# Patient Record
Sex: Female | Born: 1995 | Race: White | Hispanic: No | Marital: Married | Smoking: Former smoker
Health system: Southern US, Community
[De-identification: ages and names within clinical notes are randomized; demographics above are authoritative.]

## PROBLEM LIST (undated history)

## (undated) ENCOUNTER — Emergency Department (HOSPITAL_COMMUNITY): Admission: EM | Payer: Self-pay | Source: Home / Self Care

## (undated) DIAGNOSIS — E282 Polycystic ovarian syndrome: Secondary | ICD-10-CM

## (undated) HISTORY — DX: Polycystic ovarian syndrome: E28.2

---
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
## 2015-01-02 IMAGING — CT CT ABD-PELV W/ CM
2 of 3 series · 17 of 46 positions shown, 19 images · IV contrast (Omnipaque 300)
Comparison: None.

CLINICAL DATA: abdominal pain

EXAM:
CT ABDOMEN AND PELVIS WITH CONTRAST
TECHNIQUE: Multidetector CT imaging of the abdomen and pelvis was performed
using the standard protocol following bolus administration of
intravenous contrast.
CONTRAST:  100mL OMNIPAQUE IOHEXOL 300 MG/ML  SOLN

[Series 2: abd_pel_with 5.0 b40f · axial · 0.80mm/px · z∈[-528,-98]mm · 14 of 100 slices shown, 16 images]
[im 7/100  soft-tissue]
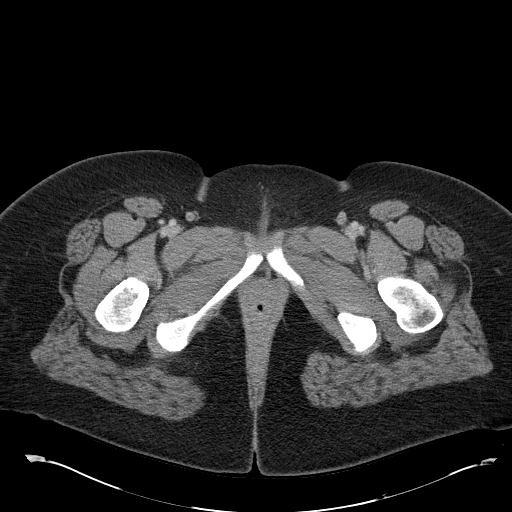
[im 7/100  bone]
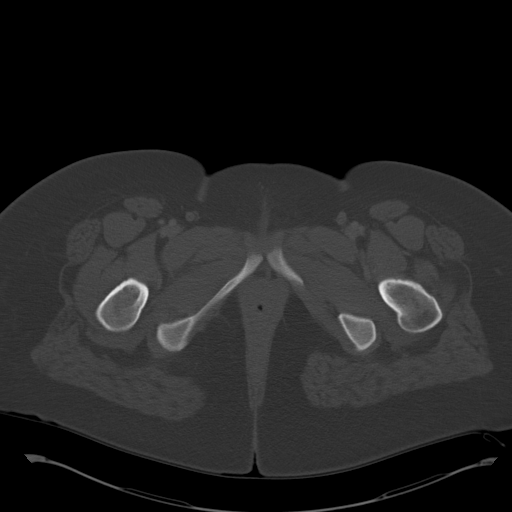
[im 13/100  soft-tissue]
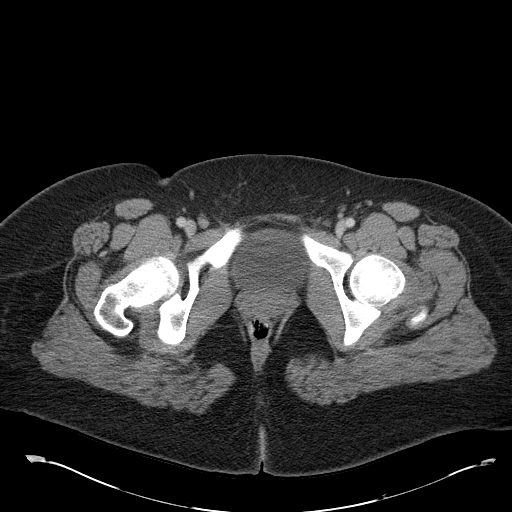
[im 20/100  soft-tissue]
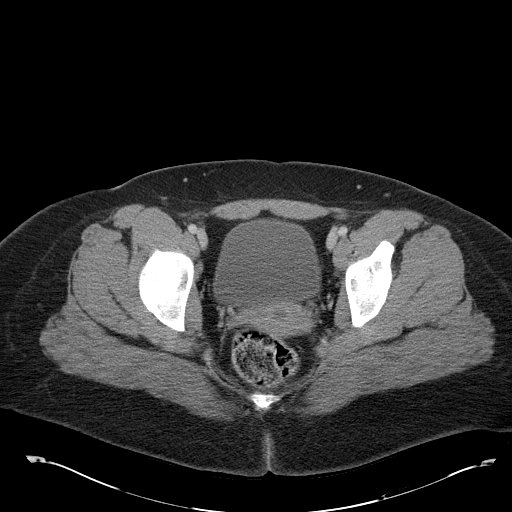
[im 26/100  soft-tissue]
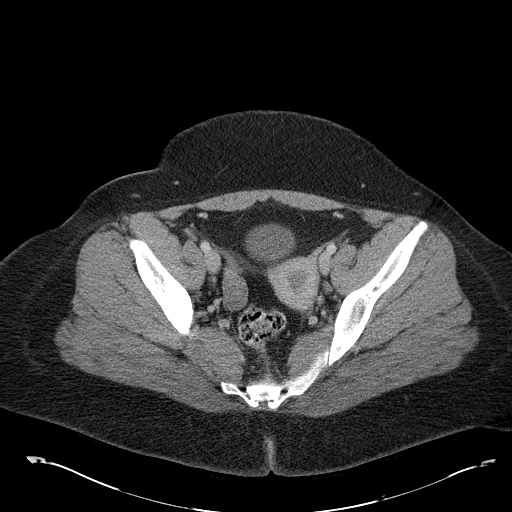
[im 32/100  soft-tissue]
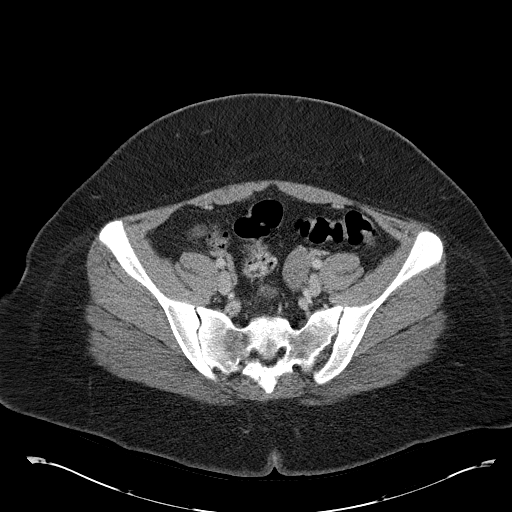
[im 39/100  soft-tissue]
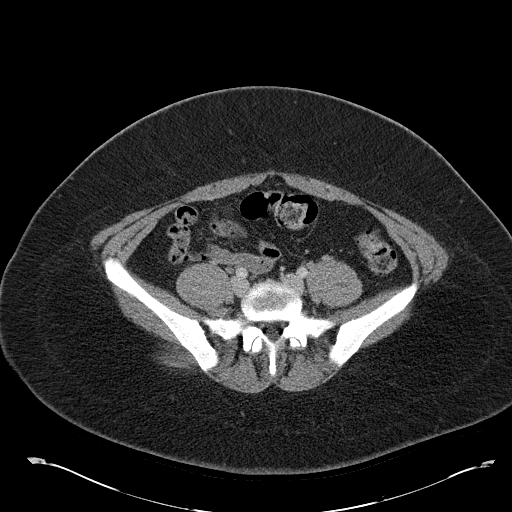
[im 45/100  soft-tissue]
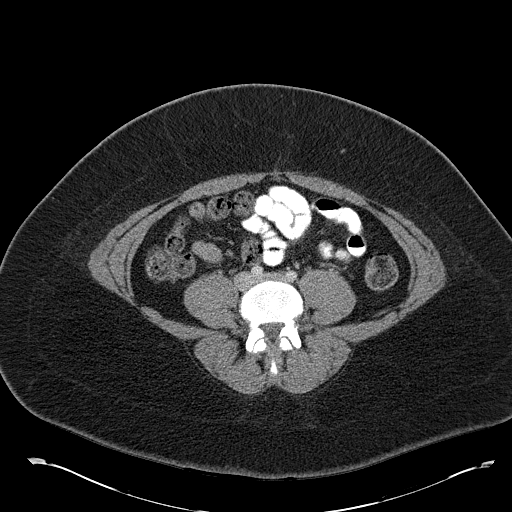
[im 55/100  soft-tissue]
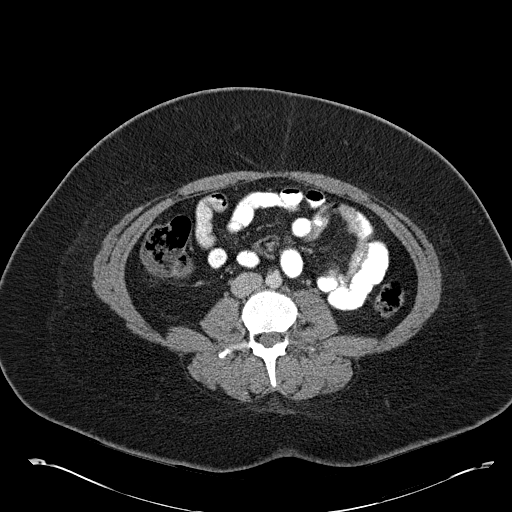
[im 61/100  soft-tissue]
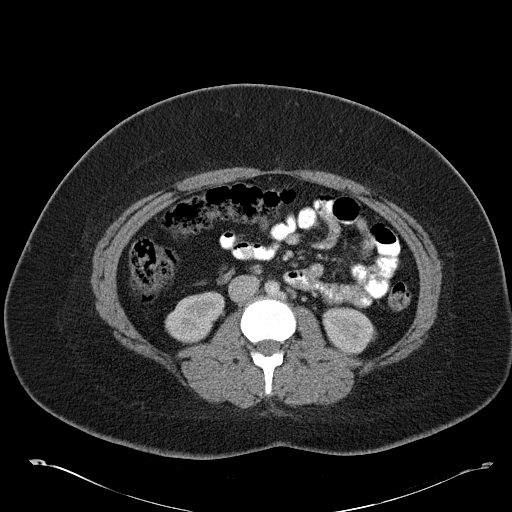
[im 61/100  bone]
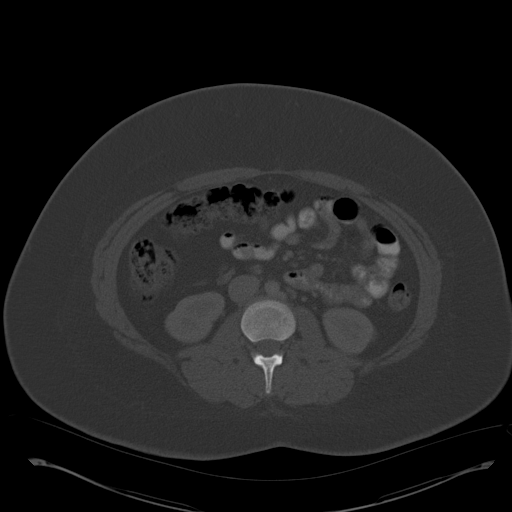
[im 68/100  soft-tissue]
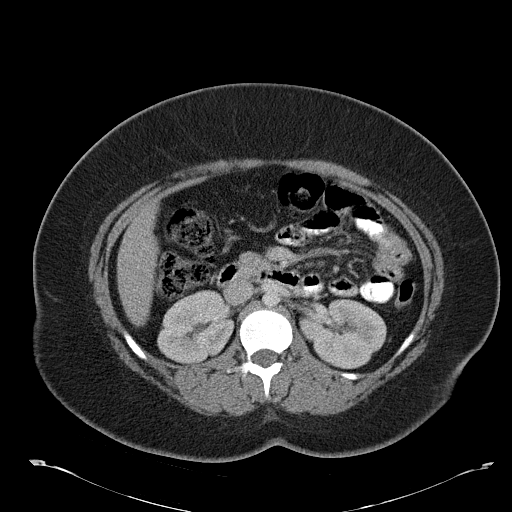
[im 74/100  soft-tissue]
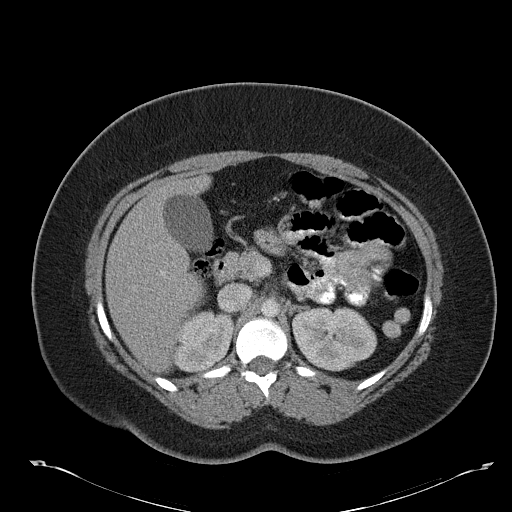
[im 80/100  soft-tissue]
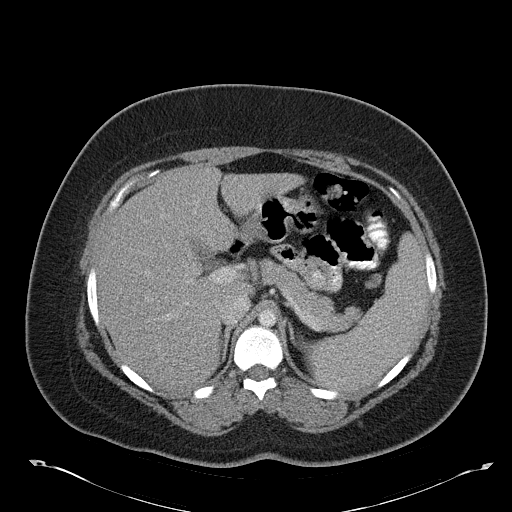
[im 87/100  soft-tissue]
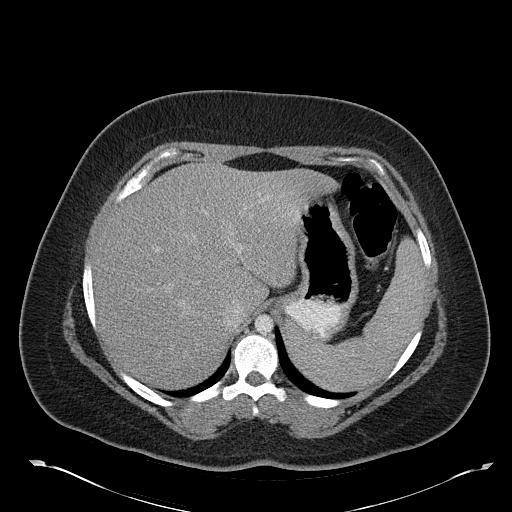
[im 93/100  soft-tissue]
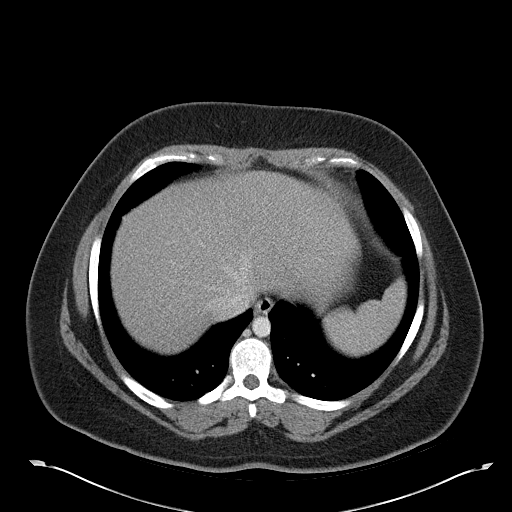

[Series 3: abd_pel_with 3.0 spo cor · coronal · 0.93mm/px · 3 of 96 slices shown]
[im 32/96  soft-tissue]
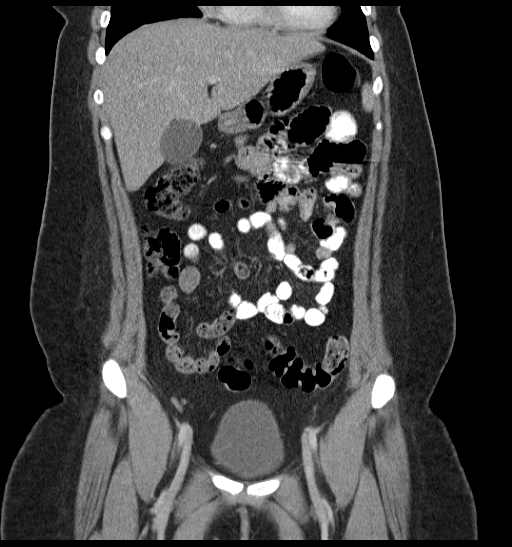
[im 43/96  soft-tissue]
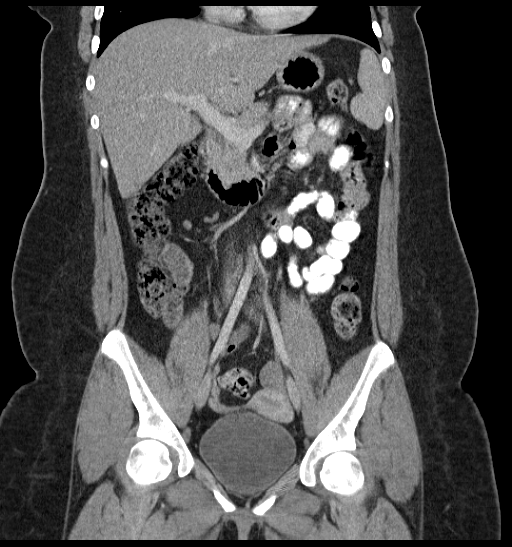
[im 53/96  soft-tissue]
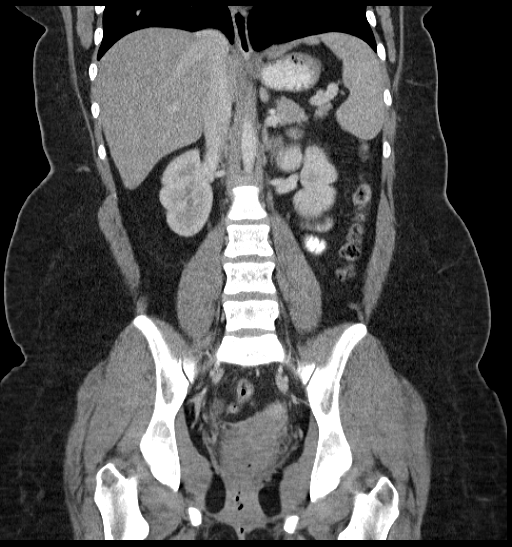

[17 of 46 positions shown; findings below may reference images not displayed]

FINDINGS: Visualized lung bases clear. Unremarkable liver, gallbladder,
spleen, pancreas, adrenal glands, kidneys, abdominal aorta, portal
vein. Circumaortic left renal vein, an anatomic variant. Stomach,
small bowel, colon are nondilated. Normal appendix. Urinary bladder
physiologically distended. Uterus and adnexal regions unremarkable.
No ascites. No free air. No adenopathy localized. Regional bones
unremarkable.
IMPRESSION: 1. No acute process.

## 2019-06-26 ENCOUNTER — Telehealth: Payer: Self-pay | Admitting: Adult Health

## 2019-06-26 NOTE — Telephone Encounter (Signed)

## 2019-06-27 ENCOUNTER — Ambulatory Visit: Payer: No Typology Code available for payment source | Admitting: Adult Health

## 2019-06-27 ENCOUNTER — Encounter: Payer: Self-pay | Admitting: Adult Health

## 2019-06-27 VITALS — BP 141/89 | HR 85 | Ht 66.0 in | Wt 316.8 lb

## 2019-06-27 DIAGNOSIS — D500 Iron deficiency anemia secondary to blood loss (chronic): Secondary | ICD-10-CM

## 2019-06-27 DIAGNOSIS — R030 Elevated blood-pressure reading, without diagnosis of hypertension: Secondary | ICD-10-CM | POA: Insufficient documentation

## 2019-06-27 DIAGNOSIS — Z3202 Encounter for pregnancy test, result negative: Secondary | ICD-10-CM

## 2019-06-27 DIAGNOSIS — Z8742 Personal history of other diseases of the female genital tract: Secondary | ICD-10-CM

## 2019-06-27 DIAGNOSIS — N926 Irregular menstruation, unspecified: Secondary | ICD-10-CM | POA: Diagnosis not present

## 2019-06-27 DIAGNOSIS — Z319 Encounter for procreative management, unspecified: Secondary | ICD-10-CM | POA: Insufficient documentation

## 2019-06-27 LAB — POCT HEMOGLOBIN: Hemoglobin: 10.8 g/dL — AB (ref 11–14.6)

## 2019-06-27 LAB — POCT URINE PREGNANCY: Preg Test, Ur: NEGATIVE

## 2019-06-27 NOTE — Progress Notes (Signed)
Patient ID: Darlene Mullins, female   DOB: 1995/04/13, 24 y.o.   MRN: [id number] History of Present Illness: Darlene Mullins is a 24 year old white female, engaged, G0P0, in to discuss periods. Will skip periods or may bleed for 3-4 months,was told about 3 years ago had PCO.   Current Medications, Allergies, Past Medical History, Past Surgical History, Family History and Social History were reviewed in [name] record.     Review of Systems: Has irregular periods, may skip or may bleed for 3-4 months, has bleed from January till about 3 days ago Denies any pain Was told had PCO about 3 years Would like to be pregnant    Physical Exam:BP (!) 141/89 (BP Location: Left Arm, Patient Position: Sitting, Cuff Size: Large)   Pulse 85   Ht 5\' 6"  (1.676 m)   Wt (!) 316 lb 12.8 oz (143.7 kg)   BMI 51.13 kg/m UPT is negative, hgb 10.8 General:  Well developed, well nourished, no acute distress Skin:  Warm and dry Neck:  Midline trachea, normal thyroid, good ROM, no lymphadenopathy Lungs; Clear to auscultation bilaterally Cardiovascular: Regular rate and rhythm Pelvic:  External genitalia is normal in appearance, no lesions.  The vagina is normal in appearance. Urethra has no lesions or masses. The cervix is smooth and pink  Uterus is felt to be normal size, shape, and contour.  No adnexal masses or tenderness noted.Bladder is non tender, no masses felt. Psych:  No mood changes, alert and cooperative,seems happy AA2 Fall risk is low PHQ 9 score is 0 Examination chaperoned by LPN  Impression and Plan: 1. Urine pregnancy test negative  2. Irregular menstrual bleeding Will get Darlene Mullins to assess uterus and ovaries.  3. History of PCOS Will get [place] to assess uterus and ovaries  4. Patient desires pregnancy Start PNV  discussed timing of sex  5. Elevated BP without diagnosis of hypertension Decrease salt and sugar Return about 1-2 weeks after [place] for pap and  physical   6. Iron deficiency anemia due to chronic blood loss Start OTC PNV

## 2019-06-27 NOTE — Patient Instructions (Signed)
DASH Eating Plan DASH stands for "Dietary Approaches to Stop Hypertension." The DASH eating plan is a healthy eating plan that has been shown to reduce high blood pressure (hypertension). It may also reduce your risk for type 2 diabetes, heart disease, and stroke. The DASH eating plan may also help with weight loss. What are tips for following this plan?  General guidelines  Avoid eating more than 2,300 mg (milligrams) of salt (sodium) a day. If you have hypertension, you may need to reduce your sodium intake to 1,500 mg a day.  Limit alcohol intake to no more than 1 drink a day for nonpregnant women and 2 drinks a day for men. One drink equals 12 oz of beer, 5 oz of wine, or 1 oz of hard liquor.  Work with your health care provider to maintain a healthy body weight or to lose weight. Ask what an ideal weight is for you.  Get at least 30 minutes of exercise that causes your heart to beat faster (aerobic exercise) most days of the week. Activities may include walking, swimming, or biking.  Work with your health care provider or diet and nutrition specialist (dietitian) to adjust your eating plan to your individual calorie needs. Reading food labels   Check food labels for the amount of sodium per serving. Choose foods with less than 5 percent of the Daily Value of sodium. Generally, foods with less than 300 mg of sodium per serving fit into this eating plan.  To find whole grains, look for the word "whole" as the first word in the ingredient list. Shopping  Buy products labeled as "low-sodium" or "no salt added."  Buy fresh foods. Avoid canned foods and premade or frozen meals. Cooking  Avoid adding salt when cooking. Use salt-free seasonings or herbs instead of table salt or sea salt. Check with your health care provider or pharmacist before using salt substitutes.  Do not fry foods. Cook foods using healthy methods such as baking, boiling, grilling, and broiling instead.  Cook with  heart-healthy oils, such as olive, canola, soybean, or sunflower oil. Meal planning  Eat a balanced diet that includes: ? 5 or more servings of fruits and vegetables each day. At each meal, try to fill half of your plate with fruits and vegetables. ? Up to 6-8 servings of whole grains each day. ? Less than 6 oz of lean meat, poultry, or fish each day. A 3-oz serving of meat is about the same size as a deck of cards. One egg equals 1 oz. ? 2 servings of low-fat dairy each day. ? A serving of nuts, seeds, or beans 5 times each week. ? Heart-healthy fats. Healthy fats called Omega-3 fatty acids are found in foods such as flaxseeds and coldwater fish, like sardines, salmon, and mackerel.  Limit how much you eat of the following: ? Canned or prepackaged foods. ? Food that is high in trans fat, such as fried foods. ? Food that is high in saturated fat, such as fatty meat. ? Sweets, desserts, sugary drinks, and other foods with added sugar. ? Full-fat dairy products.  Do not salt foods before eating.  Try to eat at least 2 vegetarian meals each week.  Eat more home-cooked food and less restaurant, buffet, and fast food.  When eating at a restaurant, ask that your food be prepared with less salt or no salt, if possible. What foods are recommended? The items listed may not be a complete list. Talk with your dietitian about   what dietary choices are best for you. Grains Whole-grain or whole-wheat bread. Whole-grain or whole-wheat pasta. Brown rice. Oatmeal. Quinoa. Bulgur. Whole-grain and low-sodium cereals. Pita bread. Low-fat, low-sodium crackers. Whole-wheat flour tortillas. Vegetables Fresh or frozen vegetables (raw, steamed, roasted, or grilled). Low-sodium or reduced-sodium tomato and vegetable juice. Low-sodium or reduced-sodium tomato sauce and tomato paste. Low-sodium or reduced-sodium canned vegetables. Fruits All fresh, dried, or frozen fruit. Canned fruit in natural juice (without  added sugar). Meat and other protein foods Skinless chicken or turkey. Ground chicken or turkey. Pork with fat trimmed off. Fish and seafood. Egg whites. Dried beans, peas, or lentils. Unsalted nuts, nut butters, and seeds. Unsalted canned beans. Lean cuts of beef with fat trimmed off. Low-sodium, lean deli meat. Dairy Low-fat (1%) or fat-free (skim) milk. Fat-free, low-fat, or reduced-fat cheeses. Nonfat, low-sodium ricotta or cottage cheese. Low-fat or nonfat yogurt. Low-fat, low-sodium cheese. Fats and oils Soft margarine without trans fats. Vegetable oil. Low-fat, reduced-fat, or light mayonnaise and salad dressings (reduced-sodium). Canola, safflower, olive, soybean, and sunflower oils. Avocado. Seasoning and other foods Herbs. Spices. Seasoning mixes without salt. Unsalted popcorn and pretzels. Fat-free sweets. What foods are not recommended? The items listed may not be a complete list. Talk with your dietitian about what dietary choices are best for you. Grains Baked goods made with fat, such as croissants, muffins, or some breads. Dry pasta or rice meal packs. Vegetables Creamed or fried vegetables. Vegetables in a cheese sauce. Regular canned vegetables (not low-sodium or reduced-sodium). Regular canned tomato sauce and paste (not low-sodium or reduced-sodium). Regular tomato and vegetable juice (not low-sodium or reduced-sodium). Pickles. Olives. Fruits Canned fruit in a light or heavy syrup. Fried fruit. Fruit in cream or butter sauce. Meat and other protein foods Fatty cuts of meat. Ribs. Fried meat. Bacon. Sausage. Bologna and other processed lunch meats. Salami. Fatback. Hotdogs. Bratwurst. Salted nuts and seeds. Canned beans with added salt. Canned or smoked fish. Whole eggs or egg yolks. Chicken or turkey with skin. Dairy Whole or 2% milk, cream, and half-and-half. Whole or full-fat cream cheese. Whole-fat or sweetened yogurt. Full-fat cheese. Nondairy creamers. Whipped toppings.  Processed cheese and cheese spreads. Fats and oils Butter. Stick margarine. Lard. Shortening. Ghee. Bacon fat. Tropical oils, such as coconut, palm kernel, or palm oil. Seasoning and other foods Salted popcorn and pretzels. Onion salt, garlic salt, seasoned salt, table salt, and sea salt. Worcestershire sauce. Tartar sauce. Barbecue sauce. Teriyaki sauce. Soy sauce, including reduced-sodium. Steak sauce. Canned and packaged gravies. Fish sauce. Oyster sauce. Cocktail sauce. Horseradish that you find on the shelf. Ketchup. Mustard. Meat flavorings and tenderizers. Bouillon cubes. Hot sauce and Tabasco sauce. Premade or packaged marinades. Premade or packaged taco seasonings. Relishes. Regular salad dressings. Where to find more information:  National Heart, Lung, and Blood Institute: www.nhlbi.nih.gov  American Heart Association: www.heart.org Summary  The DASH eating plan is a healthy eating plan that has been shown to reduce high blood pressure (hypertension). It may also reduce your risk for type 2 diabetes, heart disease, and stroke.  With the DASH eating plan, you should limit salt (sodium) intake to 2,300 mg a day. If you have hypertension, you may need to reduce your sodium intake to 1,500 mg a day.  When on the DASH eating plan, aim to eat more fresh fruits and vegetables, whole grains, lean proteins, low-fat dairy, and heart-healthy fats.  Work with your health care provider or diet and nutrition specialist (dietitian) to adjust your eating plan to your   individual calorie needs. This information is not intended to replace advice given to you by your health care provider. Make sure you discuss any questions you have with your health care provider. Document Revised: 12/30/2016 Document Reviewed: 01/11/2016 Elsevier Patient Education  2020 Elsevier Inc.  

## 2019-07-03 ENCOUNTER — Telehealth: Payer: Self-pay | Admitting: Adult Health

## 2019-07-03 NOTE — Telephone Encounter (Signed)

## 2019-07-04 ENCOUNTER — Ambulatory Visit (INDEPENDENT_AMBULATORY_CARE_PROVIDER_SITE_OTHER): Payer: No Typology Code available for payment source

## 2019-07-04 DIAGNOSIS — Z8742 Personal history of other diseases of the female genital tract: Secondary | ICD-10-CM

## 2019-07-04 DIAGNOSIS — N926 Irregular menstruation, unspecified: Secondary | ICD-10-CM | POA: Diagnosis not present

## 2019-07-04 NOTE — Progress Notes (Signed)
PELVIC US TA/TV:homogeneous anteverted uterus,wnl,EEC 16 mm,enlarged ovaries with multiple small peripheral follicles,ovaries appear mobile,no free fluid,no pain during ultrasound  Chaperone Peggy

## 2019-07-09 ENCOUNTER — Other Ambulatory Visit (HOSPITAL_COMMUNITY)
Admission: RE | Admit: 2019-07-09 | Discharge: 2019-07-09 | Disposition: A | Discharge status: Home / Self Care | Payer: No Typology Code available for payment source | Source: Ambulatory Visit | Attending: Adult Health | Admitting: Adult Health

## 2019-07-09 ENCOUNTER — Encounter: Payer: Self-pay | Admitting: Adult Health

## 2019-07-09 ENCOUNTER — Ambulatory Visit (INDEPENDENT_AMBULATORY_CARE_PROVIDER_SITE_OTHER): Payer: No Typology Code available for payment source | Admitting: Adult Health

## 2019-07-09 VITALS — BP 134/88 | HR 88 | Ht 67.0 in | Wt 318.0 lb

## 2019-07-09 DIAGNOSIS — E282 Polycystic ovarian syndrome: Secondary | ICD-10-CM

## 2019-07-09 DIAGNOSIS — Z01419 Encounter for gynecological examination (general) (routine) without abnormal findings: Secondary | ICD-10-CM | POA: Insufficient documentation

## 2019-07-09 DIAGNOSIS — N926 Irregular menstruation, unspecified: Secondary | ICD-10-CM | POA: Diagnosis not present

## 2019-07-09 DIAGNOSIS — Z3202 Encounter for pregnancy test, result negative: Secondary | ICD-10-CM

## 2019-07-09 DIAGNOSIS — Z8742 Personal history of other diseases of the female genital tract: Secondary | ICD-10-CM

## 2019-07-09 LAB — POCT URINE PREGNANCY: Preg Test, Ur: NEGATIVE

## 2019-07-09 MED ORDER — MEDROXYPROGESTERONE ACETATE 10 MG PO TABS: ORAL_TABLET | ORAL | 0 refills | Status: DC

## 2019-07-09 NOTE — Progress Notes (Addendum)
Patient ID: Darlene Mullins, female   DOB: 1995/02/01, 24 y.o.   MRN: [id number] History of Present Illness: Darlene Mullins is a 24 year old white female, single with partner, G0P0, but would like a pregnancy maybe early next year.She is here for well woman gyn exam and pap.    Current Medications, Allergies, Past Medical History, Past Surgical History, Family History and Social History were reviewed in [name] record.     Review of Systems: Patient denies any headaches, hearing loss, fatigue, blurred vision, shortness of breath, chest pain, abdominal pain, problems with bowel movements, urination, or intercourse. No joint pain or mood swings. +irreguarl periods +facial hair    Physical Exam:BP 134/88 (BP Location: Left Arm, Patient Position: Sitting, Cuff Size: Large)   Pulse 88   Ht 5\' 7"  (1.702 m)   Wt (!) 318 lb (144.2 kg)   BMI 49.81 kg/m UPT is negative. General:  Well developed, well nourished, no acute distress Skin:  Warm and dry,+chin hair,shaves Neck:  Midline trachea, normal thyroid, good ROM, no lymphadenopathy Lungs; Clear to auscultation bilaterally Breast:  No dominant palpable mass, retraction, or nipple discharge Cardiovascular: Regular rate and rhythm Abdomen:  Soft, non tender, no hepatosplenomegaly,obese Pelvic:  External genitalia is normal in appearance, no lesions.  The vagina is normal in appearance,period like blood. Urethra has no lesions or masses. The cervix is smooth, pap with GC/CH performed,  Uterus is felt to be normal size, shape, and contour.  No adnexal masses or tenderness noted.Bladder is non tender, no masses felt. Has several skin tags inner thighs Extremities/musculoskeletal:  No swelling or varicosities noted, no clubbing or cyanosis Psych:  No mood changes, alert and cooperative,seems happy AA 2 Fall risk is low PHQ 9 score is 2 Examination chaperoned by LPN Reviewed Malachy Mood: normal [place] EEC 16 mm, ovaries enlarged  with string of pearls appearance of PCO  Impression and plan: 1. Encounter for gynecological examination with Papanicolaou smear of cervix Pap sent Physical in 1 year Pap in 3 if normal Check CBC,CMP,TSH   2. Pregnancy examination or test, negative result   3. Irregular menstrual bleeding Will rx provera 10 mg for 10 days to get withdrawal bleed, then cycle for 3-6 months with OCs, like Loestrin 1.5-30 Will see back 6/24 to see how much bleeding she had  CheckCMP,TSH and A1c   4. History of PCOS Try to lose about 30 lbs

## 2019-07-10 ENCOUNTER — Telehealth: Payer: Self-pay | Admitting: Adult Health

## 2019-07-10 LAB — CYTOLOGY - PAP
Chlamydia: NEGATIVE
Comment: NEGATIVE
Comment: NORMAL
Diagnosis: NEGATIVE
Neisseria Gonorrhea: NEGATIVE

## 2019-07-10 LAB — COMPREHENSIVE METABOLIC PANEL
ALT: 32 IU/L (ref 0–32)
AST: 23 IU/L (ref 0–40)
Albumin/Globulin Ratio: 1.5 (ref 1.2–2.2)
Albumin: 4.7 g/dL (ref 3.9–5.0)
Alkaline Phosphatase: 75 IU/L (ref 48–121)
BUN/Creatinine Ratio: 16 (ref 9–23)
BUN: 13 mg/dL (ref 6–20)
Bilirubin Total: 0.5 mg/dL (ref 0.0–1.2)
CO2: 25 mmol/L (ref 20–29)
Calcium: 9.8 mg/dL (ref 8.7–10.2)
Chloride: 99 mmol/L (ref 96–106)
Creatinine, Ser: 0.79 mg/dL (ref 0.57–1.00)
GFR calc Af Amer: 121 mL/min/{1.73_m2} (ref >59)
GFR calc non Af Amer: 105 mL/min/{1.73_m2} (ref >59)
Globulin, Total: 3.1 g/dL (ref 1.5–4.5)
Glucose: 96 mg/dL (ref 65–99)
Potassium: 4.4 mmol/L (ref 3.5–5.2)
Sodium: 140 mmol/L (ref 134–144)
Total Protein: 7.8 g/dL (ref 6.0–8.5)

## 2019-07-10 LAB — TSH: TSH: 4.67 u[IU]/mL — ABNORMAL HIGH (ref 0.450–4.500)

## 2019-07-10 LAB — CBC
Hematocrit: 38.3 % (ref 34.0–46.6)
Hemoglobin: 12.3 g/dL (ref 11.1–15.9)
MCH: 26.1 pg — ABNORMAL LOW (ref 26.6–33.0)
MCHC: 32.1 g/dL (ref 31.5–35.7)
MCV: 81 fL (ref 79–97)
Platelets: 303 10*3/uL (ref 150–450)
RBC: 4.71 x10E6/uL (ref 3.77–5.28)
RDW: 13.1 % (ref 11.7–15.4)
WBC: 9.3 10*3/uL (ref 3.4–10.8)

## 2019-07-10 LAB — HEMOGLOBIN A1C
Est. average glucose Bld gHb Est-mCnc: 111 mg/dL
Hgb A1c MFr Bld: 5.5 % (ref 4.8–5.6)

## 2019-07-10 NOTE — Telephone Encounter (Signed)
Pt aware of labs and that TSH slightly elevated, will recheck about 3 weeks, and pap was negative for malignancy and GC/CHL.

## 2019-07-25 ENCOUNTER — Ambulatory Visit: Payer: No Typology Code available for payment source | Admitting: Adult Health

## 2019-07-31 ENCOUNTER — Telehealth: Payer: Self-pay | Admitting: Adult Health

## 2019-07-31 NOTE — Telephone Encounter (Signed)

## 2019-08-01 ENCOUNTER — Ambulatory Visit (INDEPENDENT_AMBULATORY_CARE_PROVIDER_SITE_OTHER): Payer: No Typology Code available for payment source | Admitting: Adult Health

## 2019-08-01 ENCOUNTER — Encounter: Payer: Self-pay | Admitting: Adult Health

## 2019-08-01 VITALS — BP 141/86 | HR 93 | Ht 67.0 in | Wt 321.5 lb

## 2019-08-01 DIAGNOSIS — R7989 Other specified abnormal findings of blood chemistry: Secondary | ICD-10-CM | POA: Diagnosis not present

## 2019-08-01 DIAGNOSIS — Z8742 Personal history of other diseases of the female genital tract: Secondary | ICD-10-CM

## 2019-08-01 DIAGNOSIS — N926 Irregular menstruation, unspecified: Secondary | ICD-10-CM | POA: Diagnosis not present

## 2019-08-01 DIAGNOSIS — Z319 Encounter for procreative management, unspecified: Secondary | ICD-10-CM | POA: Diagnosis not present

## 2019-08-01 MED ORDER — NORETHIN ACE-ETH ESTRAD-FE 1.5-30 MG-MCG PO TABS: 1.0000 | ORAL_TABLET | Freq: Every day | ORAL | 11 refills | Status: DC

## 2019-08-01 NOTE — Progress Notes (Addendum)
  Subjective:     Patient ID: Darlene Mullins, female   DOB: 26-Dec-1995, 24 y.o.   MRN: [id number]  HPI Darlene Mullins is a 24 year old white female with SO, G0P0 back in follow up on starting provera and did have a period, still on.   Review of Systems She did have bleeding with provera, still on Reviewed past medical,surgical, social and family history. Reviewed medications and allergies.     Objective:   Physical Exam BP (!) 141/86 (BP Location: Left Arm, Patient Position: Sitting, Cuff Size: Large)   Pulse 93   Ht 5\' 7"  (1.702 m)   Wt (!) 321 lb 8 oz (145.8 kg)   LMP 07/13/2019 (Approximate)   BMI 50.35 kg/m  Skin warm and dry. Lungs: clear to ausculation bilaterally. Cardiovascular: regular rate and rhythm.  Upstream - 08/01/19 0959      Pregnancy Intention Screening   Does the patient want to become pregnant in the next year? Yes    Does the patient's partner want to become pregnant in the next year? No    Would the patient like to discuss contraceptive options today? No      Contraception Wrap Up   Current Method Pregnant/Seeking Pregnancy    End Method Pregnant/Seeking Pregnancy    Contraception Counseling Provided No         Reviewed labs and pap with her again.     Assessment:     1. Irregular menstrual bleeding Will rx Loestrin 1.5 30 Fe to start Sunday, will use to cycle periods for 3-6 months  Meds ordered this encounter  Medications  . norethindrone-ethinyl estradiol-iron (LOESTRIN FE) 1.5-30 MG-MCG tablet    Sig: Take 1 tablet by mouth daily.    Dispense:  28 tablet    Refill:  11    Order Specific Question:   Supervising Provider    Answer:   12-04-1977 H [[id number]]    2. History of PCOS  3. Patient desires pregnancy Did discuss if she does not ovulate after stopping OCs, can try clomid to induce ovulation   4. Elevated TSH Recheck TSH and Free T4    Plan:     Work on losing some weight Follow up in 3 months

## 2019-08-02 LAB — T4, FREE: Free T4: 1.28 ng/dL (ref 0.82–1.77)

## 2019-08-02 LAB — TSH: TSH: 2.74 u[IU]/mL (ref 0.450–4.500)

## 2019-08-06 ENCOUNTER — Telehealth: Payer: Self-pay | Admitting: *Deleted

## 2019-08-06 NOTE — Telephone Encounter (Signed)
-----   Message from Adline Potter, NP sent at 08/06/2019  9:28 AM EDT ----- Let pt know thyroid is normal

## 2019-08-06 NOTE — Telephone Encounter (Signed)
Pt aware thyroid is normal. Pt voiced understanding. JSY

## 2019-08-22 ENCOUNTER — Telehealth: Payer: Self-pay | Admitting: Adult Health

## 2019-08-22 NOTE — Telephone Encounter (Signed)
Patient wants someone to review birth control instructions with her

## 2019-08-23 NOTE — Telephone Encounter (Signed)
Called patient back she wanted to know if she should take the inactive pills. I told her it wouldn't be a bad idea just to take them to stay in the habit of taking a pill everyday. Patient agreeable and had no other questions at this time.

## 2019-11-04 ENCOUNTER — Encounter: Payer: Self-pay | Admitting: Adult Health

## 2019-11-04 ENCOUNTER — Ambulatory Visit (INDEPENDENT_AMBULATORY_CARE_PROVIDER_SITE_OTHER): Payer: No Typology Code available for payment source | Admitting: Adult Health

## 2019-11-04 VITALS — BP 138/79 | HR 86 | Ht 65.0 in | Wt 317.2 lb

## 2019-11-04 DIAGNOSIS — N926 Irregular menstruation, unspecified: Secondary | ICD-10-CM

## 2019-11-04 DIAGNOSIS — Z319 Encounter for procreative management, unspecified: Secondary | ICD-10-CM | POA: Diagnosis not present

## 2019-11-04 DIAGNOSIS — Z8742 Personal history of other diseases of the female genital tract: Secondary | ICD-10-CM | POA: Diagnosis not present

## 2019-11-04 NOTE — Progress Notes (Signed)
°  Subjective:     Patient ID: Darlene Mullins, female   DOB: 1995-02-10, 24 y.o.   MRN: [id number]  HPI Darlene Mullins is a 24 year old white female,engaged, getting married, [place]19 [place] the beach, back in follow up on cycling with OCs and had period every month, stopped OCs in September with LMP.   Review of Systems Periods regular with OCs Reviewed past medical,surgical, social and family history. Reviewed medications and allergies.     Objective:   Physical Exam BP 138/79 (BP Location: Left Arm, Patient Position: Sitting, Cuff Size: Large)    Pulse 86    Ht 5\' 5"  (1.651 m)    Wt (!) 317 lb 3.2 oz (143.9 kg)    LMP 10/29/2019 (Exact Date)    BMI 52.78 kg/m  Skin warm and dry.  Lungs: clear to ausculation bilaterally. Cardiovascular: regular rate and rhythm. Has lost 4 lbs  Upstream - 11/04/19 0839      Pregnancy Intention Screening   Does the patient want to become pregnant in the next year? Yes    Does the patient's partner want to become pregnant in the next year? Yes    Would the patient like to discuss contraceptive options today? No      Contraception Wrap Up   Current Method No Contraceptive Precautions    End Method No Contraception Precautions;Pregnant/Seeking Pregnancy    Contraception Counseling Provided No             Assessment:     1. Patient desires pregnancy Start OTC PNV  2. History of PCOS   3. Irregular menstrual bleeding     Plan:     Call me if period or not in October, may need clomid

## 2019-12-18 ENCOUNTER — Other Ambulatory Visit: Payer: Self-pay

## 2019-12-18 ENCOUNTER — Telehealth: Payer: Self-pay | Admitting: *Deleted

## 2019-12-18 ENCOUNTER — Other Ambulatory Visit: Payer: Self-pay | Admitting: Advanced Practice Midwife

## 2019-12-18 ENCOUNTER — Ambulatory Visit (INDEPENDENT_AMBULATORY_CARE_PROVIDER_SITE_OTHER): Payer: No Typology Code available for payment source | Admitting: Adult Health

## 2019-12-18 ENCOUNTER — Encounter: Payer: Self-pay | Admitting: Adult Health

## 2019-12-18 VITALS — BP 135/87 | HR 86 | Ht 67.0 in | Wt 302.6 lb

## 2019-12-18 DIAGNOSIS — N926 Irregular menstruation, unspecified: Secondary | ICD-10-CM | POA: Diagnosis not present

## 2019-12-18 DIAGNOSIS — Z319 Encounter for procreative management, unspecified: Secondary | ICD-10-CM | POA: Diagnosis not present

## 2019-12-18 LAB — POCT URINE PREGNANCY: Preg Test, Ur: NEGATIVE

## 2019-12-18 MED ORDER — PRENATAL PLUS IRON 29-1 MG PO TABS: ORAL_TABLET | ORAL | 12 refills | Status: DC

## 2019-12-18 MED ORDER — PRENATAL VITAMINS 28-0.8 MG PO TABS
1.0000 | ORAL_TABLET | Freq: Every day | ORAL | 11 refills | Status: DC
Start: 2019-12-18 — End: 2020-03-25

## 2019-12-18 MED ORDER — CLOMIPHENE CITRATE 50 MG PO TABS: ORAL_TABLET | ORAL | 2 refills | Status: DC

## 2019-12-18 NOTE — Telephone Encounter (Signed)
Patient states this is for the prenatal

## 2019-12-18 NOTE — Progress Notes (Signed)
  Subjective:     Patient ID: Darlene Mullins, female   DOB: 1995/09/12, 24 y.o.   MRN: [id number]  HPI Darlene Mullins is a 24 year old white female, married, G0P0, in having missed a period and desires pregnancy.   Review of Systems No period since September Had COVID 2 weeks ago  Reviewed past medical,surgical, social and family history. Reviewed medications and allergies.     Objective:   Physical Exam BP 135/87 (BP Location: Right Arm, Patient Position: Sitting, Cuff Size: Large)   Pulse 86   Ht 5\' 7"  (1.702 m)   Wt (!) 302 lb 9.6 oz (137.3 kg)   LMP 10/18/2019 (Approximate)   BMI 47.39 kg/m UPT is negative She has lost 15 lbs since 11/04/19   Upstream - 12/18/19 1203      Pregnancy Intention Screening   Does the patient want to become pregnant in the next year? Yes    Does the patient's partner want to become pregnant in the next year? Yes    Would the patient like to discuss contraceptive options today? No      Contraception Wrap Up   Current Method Pregnant/Seeking Pregnancy    End Method Pregnant/Seeking Pregnancy    Contraception Counseling Provided No             Assessment:     1. Missed periods UPT is negative   2. Patient desires pregnancy Will start clomid today, have sex every other day 7-24 of cycle and take PNV Continue weight loss efforts  Meds ordered this encounter  Medications  . clomiPHENE (CLOMID) 50 MG tablet    Sig: Take 1 daily for 5 days, on days 3-7 of cycle    Dispense:  5 tablet    Refill:  2    Order Specific Question:   Supervising Provider    Answer:   Darlene Mullins [[id number]]  . Prenatal Vit-Iron Carbonyl-FA (PRENATAL PLUS IRON) 29-1 MG TABS    Sig: Take 1 daily    Dispense:  30 tablet    Refill:  12    Order Specific Question:   Supervising Provider    Answer:   Darlene Mullins [[id number]]      Plan:     Check progesterone level 12/6

## 2019-12-18 NOTE — Telephone Encounter (Signed)
Patient called stating [facility] could not fill her prescription, they told her she would need a different pill.

## 2020-01-06 ENCOUNTER — Telehealth: Payer: Self-pay

## 2020-01-06 NOTE — Telephone Encounter (Signed)
Pt wanted Darlene Mullins to know that since she started the medication she has not had a period

## 2020-01-06 NOTE — Telephone Encounter (Signed)
LMP end of September, had negative HPT and had progesterone level drawn today will talk when that is back

## 2020-01-07 LAB — PROGESTERONE: Progesterone: 0.6 ng/mL

## 2020-01-08 ENCOUNTER — Telehealth: Payer: Self-pay

## 2020-01-08 NOTE — Telephone Encounter (Signed)
Pt wanting to know if there will be another round of ovulation medication ordered?

## 2020-01-08 NOTE — Telephone Encounter (Signed)
Called patient back. She wanted to know if she can go ahead and start the clomid now since she didn't have a period.

## 2020-01-13 NOTE — Telephone Encounter (Signed)
Pt aware that she has refills on clomid, and HPT negative can start clomid and count that day as day 3 and let me know when day 21 is so I can place order to check progesterone level. She is aware that can go as high as 150 mg and if still no ovulation or pregnancy will refer to fertility specialist

## 2020-03-17 ENCOUNTER — Telehealth: Payer: Self-pay | Admitting: Adult Health

## 2020-03-17 NOTE — Telephone Encounter (Signed)
Has appt next week, has questions about clomid, answered.

## 2020-03-17 NOTE — Telephone Encounter (Signed)
Patient wants Cyril Mourning to call her so she can discuss some things with her, per patient. (patient didn't give a reason) Clinical staff will follow up with patient.

## 2020-03-19 ENCOUNTER — Ambulatory Visit: Payer: No Typology Code available for payment source | Admitting: Adult Health

## 2020-03-25 ENCOUNTER — Encounter: Payer: Self-pay | Admitting: Adult Health

## 2020-03-25 ENCOUNTER — Other Ambulatory Visit: Payer: Self-pay

## 2020-03-25 ENCOUNTER — Ambulatory Visit (INDEPENDENT_AMBULATORY_CARE_PROVIDER_SITE_OTHER): Payer: No Typology Code available for payment source | Admitting: Adult Health

## 2020-03-25 VITALS — BP 152/93 | HR 102 | Ht 68.0 in | Wt 316.0 lb

## 2020-03-25 DIAGNOSIS — Z3202 Encounter for pregnancy test, result negative: Secondary | ICD-10-CM | POA: Diagnosis not present

## 2020-03-25 DIAGNOSIS — N926 Irregular menstruation, unspecified: Secondary | ICD-10-CM

## 2020-03-25 DIAGNOSIS — Z8742 Personal history of other diseases of the female genital tract: Secondary | ICD-10-CM | POA: Diagnosis not present

## 2020-03-25 DIAGNOSIS — Z319 Encounter for procreative management, unspecified: Secondary | ICD-10-CM

## 2020-03-25 LAB — POCT URINE PREGNANCY: Preg Test, Ur: NEGATIVE

## 2020-03-25 MED ORDER — PRENATAL VITAMINS 28-0.8 MG PO TABS: 1.0000 | ORAL_TABLET | Freq: Every day | ORAL | 11 refills | Status: DC

## 2020-03-25 NOTE — Progress Notes (Signed)
  Subjective:     Patient ID: Darlene Mullins, female   DOB: February 03, 1995, 25 y.o.   MRN: [id number]  HPI Darlene Mullins is a 25 year old white female, married, G0P0 back in follow up on taking clomid, last took in December, has progesterone level of 0.6 in December. She had period in January but none for February and has breast tenderness.   Review of Systems Missed February period +breast tenderness +weight gain of about 14lbs since November  Reviewed past medical,surgical, social and family history. Reviewed medications and allergies.     Objective:   Physical Exam BP (!) 152/93 (BP Location: Left Arm, Patient Position: Sitting, Cuff Size: Large)   Pulse (!) 102   Ht 5\' 8"  (1.727 m)   Wt (!) 316 lb (143.3 kg)   LMP 02/19/2020   BMI 48.05 kg/m     UPT is negative. Skin warm and dry. Lungs: clear to ausculation bilaterally. Cardiovascular: regular rate and rhythm. Fall risk is low  Upstream - 03/25/20 1356      Pregnancy Intention Screening   Does the patient want to become pregnant in the next year? Yes    Does the patient's partner want to become pregnant in the next year? Yes    Would the patient like to discuss contraceptive options today? No      Contraception Wrap Up   Current Method Pregnant/Seeking Pregnancy    End Method Pregnant/Seeking Pregnancy    Contraception Counseling Provided No          Assessment:     1. Pregnancy examination or test, negative result   2. History of PCOS Try to lose weight   3. Patient desires pregnancy Continue PNV Meds ordered this encounter  Medications  . Prenatal Vit-Fe Fumarate-FA (PRENATAL VITAMINS) 28-0.8 MG TABS    Sig: Take 1 tablet by mouth daily.    Dispense:  30 tablet    Refill:  11    Order Specific Question:   Supervising Provider    Answer:   03/27/20, LUTHER H [[id number]]    4. Missed period Check QHCG  Check TSH and free T4    Plan:     Will talk when labs back

## 2020-03-26 LAB — T4, FREE: Free T4: 1.34 ng/dL (ref 0.82–1.77)

## 2020-03-26 LAB — BETA HCG QUANT (REF LAB): hCG Quant: <1 m[IU]/mL

## 2020-03-26 LAB — TSH: TSH: 2.04 u[IU]/mL (ref 0.450–4.500)

## 2020-03-27 ENCOUNTER — Telehealth: Payer: Self-pay

## 2020-03-27 NOTE — Telephone Encounter (Signed)
Pt calling about lab results  

## 2020-03-27 NOTE — Telephone Encounter (Signed)
Pt aware of labs, can start clomid 03/31/20 if still no period

## 2020-03-30 ENCOUNTER — Telehealth: Payer: Self-pay | Admitting: Adult Health

## 2020-03-30 NOTE — Telephone Encounter (Signed)
Patient called stating that she would like a call back from Michie she is on a fertility medication and she would like to know if jennifer could place her on a weight loss medication that will not interfere with her fertility medication. Please contact pt

## 2020-03-30 NOTE — Telephone Encounter (Signed)
Pt aware Jenn don't recommend taking weight loss med while taking infertility meds. Can try weight watchers. Pt voiced understanding. JSY

## 2020-04-30 ENCOUNTER — Telehealth: Payer: Self-pay | Admitting: Adult Health

## 2020-04-30 DIAGNOSIS — Z319 Encounter for procreative management, unspecified: Secondary | ICD-10-CM

## 2020-04-30 DIAGNOSIS — Z3202 Encounter for pregnancy test, result negative: Secondary | ICD-10-CM

## 2020-04-30 MED ORDER — CLOMIPHENE CITRATE 50 MG PO TABS: ORAL_TABLET | ORAL | 2 refills | Status: DC

## 2020-04-30 NOTE — Telephone Encounter (Signed)
Patient wants a phone call from [facility]..(didn't clarify reason)..Clinical staff will follow up with patient.

## 2020-04-30 NOTE — Telephone Encounter (Signed)
Pt states she took the last round of Clomid 50 mg on March 5. Pt started period today. Pt wants to know if she can be bumped up to 100 mg. Pt states she was having muscle spasms under left breast. Went to the [facility] and was prescribed Flexeril. Pt hasn't started med yet. Pt wants to know if it's safe to take while on Clomid. Please advise. Thanks!! JSY

## 2020-04-30 NOTE — Telephone Encounter (Signed)
Started today, check progesterone level 4/20, order is in, and will increase clomid to a 100 mg

## 2020-05-19 ENCOUNTER — Telehealth: Payer: Self-pay | Admitting: Adult Health

## 2020-05-19 NOTE — Telephone Encounter (Signed)
Patient wants to know when to start her 2nd round of fertility pills.Clinical staff will follow up with patient.

## 2020-05-20 ENCOUNTER — Telehealth: Payer: Self-pay | Admitting: Adult Health

## 2020-05-20 NOTE — Telephone Encounter (Signed)
Patient wants a returned call from Cyril Mourning,( patient didn't clarify the reason) Just stated that she forgot to tell her something when they had a conversation earlier.

## 2020-05-20 NOTE — Telephone Encounter (Signed)
No period, had negative HPT, can start clomid tomorrow and check progesterone level 5/9

## 2020-05-20 NOTE — Telephone Encounter (Signed)
Had  period 3/31 and used clomid, we had some confusion earlier, so get progesterone level today and disregard prior conversation, call with next period or if not period call.

## 2020-05-21 ENCOUNTER — Telehealth: Payer: Self-pay

## 2020-05-21 LAB — PROGESTERONE: Progesterone: 2 ng/mL

## 2020-05-21 NOTE — Telephone Encounter (Signed)
Pt returned my call. Lab results reviewed. Pt had questions, Victorino Dike was unable to talk with the pt at the time. Instructed pt on next cycle. Pt confirmed understanding.

## 2020-05-21 NOTE — Telephone Encounter (Signed)
Called pt, no answer, left vm °

## 2020-05-21 NOTE — Telephone Encounter (Signed)
-----   Message from Adline Potter, NP sent at 05/21/2020 10:38 AM EDT ----- Let her know results

## 2020-05-21 NOTE — Telephone Encounter (Signed)
-----   Message from Jennifer A Griffin, NP sent at 05/21/2020 10:38 AM EDT ----- Let her know results 

## 2020-06-03 ENCOUNTER — Telehealth: Payer: Self-pay | Admitting: Adult Health

## 2020-06-03 DIAGNOSIS — Z319 Encounter for procreative management, unspecified: Secondary | ICD-10-CM

## 2020-06-03 NOTE — Telephone Encounter (Signed)
Pt wants to know when to start Clomid. Pt's period didn't start on 4/30. Pt's pregnancy test was negative today. Please advise. Thanks! JSY

## 2020-06-03 NOTE — Telephone Encounter (Signed)
Pt called wanted to know when she needs to start the next round of her fertility Rx

## 2020-06-04 NOTE — Telephone Encounter (Signed)
Left message.Check progesterone level May 26, start clomid May 8

## 2020-06-08 ENCOUNTER — Telehealth: Payer: Self-pay | Admitting: Adult Health

## 2020-06-08 NOTE — Telephone Encounter (Signed)
Period started 5/7, to start clomid today, ck progesterone level 5/27

## 2020-06-08 NOTE — Telephone Encounter (Signed)
Pt called and has questions about starting her fertility medication today 05/09 since her cycle started on Saturday 05/07

## 2020-06-27 LAB — PROGESTERONE: Progesterone: 0.6 ng/mL

## 2020-06-30 ENCOUNTER — Telehealth: Payer: Self-pay | Admitting: *Deleted

## 2020-06-30 NOTE — Telephone Encounter (Signed)
Pt aware she did not ovulate. Pt wonders why she is not ovulating when taking the med. Please advise. Thanks!! JSY

## 2020-06-30 NOTE — Telephone Encounter (Signed)
Left message that I called, and the clomid has not induced ovulation yet, after the cycle will increase to 150 mg and if still no ovualtion will send to fertility specialist

## 2020-06-30 NOTE — Telephone Encounter (Signed)
-----   Message from Adline Potter, NP sent at 06/30/2020  8:41 AM EDT ----- Let her know she did not ovulate

## 2020-06-30 NOTE — Telephone Encounter (Signed)
-----   Message from Jennifer A Griffin, NP sent at 06/30/2020  8:41 AM EDT ----- Let her know she did not ovulate 

## 2020-06-30 NOTE — Telephone Encounter (Signed)
Left message @ 9:47 am. JSY

## 2020-07-15 ENCOUNTER — Telehealth: Payer: Self-pay | Admitting: Adult Health

## 2020-07-22 MED ORDER — CLOMIPHENE CITRATE 50 MG PO TABS: ORAL_TABLET | ORAL | 2 refills | Status: DC

## 2020-07-22 NOTE — Telephone Encounter (Signed)
Pt will call with next period and will increase clomid to 150 mg

## 2020-07-22 NOTE — Telephone Encounter (Signed)
Pt called to state that her cycle ended 07/19/2020  Please advise

## 2020-08-04 ENCOUNTER — Telehealth: Payer: Self-pay | Admitting: Adult Health

## 2020-08-04 NOTE — Telephone Encounter (Signed)
Left message @ 5:16 pm. JSY

## 2020-08-04 NOTE — Telephone Encounter (Signed)
Patient called stating that she would like a call back from Godley to discuss what to do next regarding her Clomid. Please contact pt

## 2020-08-05 ENCOUNTER — Telehealth: Payer: Self-pay | Admitting: Adult Health

## 2020-08-05 NOTE — Telephone Encounter (Signed)
Patient called stating that she has been returning Janet's call, she states Marylu Lund called her twice. Please contact pt

## 2020-08-05 NOTE — Telephone Encounter (Signed)
Pt has taken 5 rounds of Clomid. Every time she gets lab, it states that she didn't ovulate. Pt read that she shouldn't take more than 6 rounds of Clomid. Pt wonders if she should go to fertility clinic. Please advise. Thanks! JSY

## 2020-08-05 NOTE — Telephone Encounter (Signed)
Left message @ 1:59 pm. JSY 

## 2020-08-06 ENCOUNTER — Telehealth: Payer: Self-pay | Admitting: Adult Health

## 2020-08-06 NOTE — Telephone Encounter (Signed)
Pt needs # to fertility clinic, she tried # she wrote down but thinks it's the wrong #   Please advise & call pt

## 2020-08-06 NOTE — Telephone Encounter (Signed)
Gave pt #'s to infertility clinic, [phone] and the [facility] office [phone]. JSY

## 2020-08-06 NOTE — Telephone Encounter (Signed)
Dell has done the 100 mg of clomid but not 150 mg yet and I gave her the number for [facility] to call

## 2020-08-26 ENCOUNTER — Other Ambulatory Visit: Payer: No Typology Code available for payment source | Admitting: Adult Health

## 2020-10-08 ENCOUNTER — Other Ambulatory Visit: Payer: No Typology Code available for payment source | Admitting: Adult Health

## 2020-10-28 ENCOUNTER — Other Ambulatory Visit: Payer: Self-pay

## 2020-10-28 ENCOUNTER — Encounter: Payer: Self-pay | Admitting: Adult Health

## 2020-10-28 ENCOUNTER — Ambulatory Visit (INDEPENDENT_AMBULATORY_CARE_PROVIDER_SITE_OTHER): Payer: No Typology Code available for payment source | Admitting: Adult Health

## 2020-10-28 VITALS — BP 121/80 | HR 94 | Ht 66.0 in | Wt 321.8 lb

## 2020-10-28 DIAGNOSIS — Z319 Encounter for procreative management, unspecified: Secondary | ICD-10-CM | POA: Diagnosis not present

## 2020-10-28 DIAGNOSIS — N939 Abnormal uterine and vaginal bleeding, unspecified: Secondary | ICD-10-CM | POA: Diagnosis not present

## 2020-10-28 DIAGNOSIS — Z8742 Personal history of other diseases of the female genital tract: Secondary | ICD-10-CM | POA: Diagnosis not present

## 2020-10-28 DIAGNOSIS — Z3202 Encounter for pregnancy test, result negative: Secondary | ICD-10-CM

## 2020-10-28 DIAGNOSIS — N926 Irregular menstruation, unspecified: Secondary | ICD-10-CM

## 2020-10-28 LAB — POCT URINE PREGNANCY: Preg Test, Ur: NEGATIVE

## 2020-10-28 MED ORDER — MEGESTROL ACETATE 40 MG PO TABS: ORAL_TABLET | ORAL | 0 refills | Status: AC

## 2020-10-28 MED ORDER — PRENATAL VITAMINS 28-0.8 MG PO TABS: 1.0000 | ORAL_TABLET | Freq: Every day | ORAL | 11 refills | Status: AC

## 2020-10-28 NOTE — Progress Notes (Signed)
  Subjective:     Patient ID: Darlene Mullins, female   DOB: 09/23/1995, 25 y.o.   MRN: [id number]  HPI Darlene Mullins is a 25 year old white female, married, G0P0, in complaining of bleeding almost daily since 09/22/20, she did skip period before this. She wants to discuss trying Clomid again, but did not ovulate with clomid, has hx PCO. Husband with her today. Lab Results  Component Value Date   DIAGPAP  07/09/2019    - Negative for intraepithelial lesion or malignancy (NILM)     Review of Systems Bleeding since 09/22/20 Skipped period Denies any pain Reviewed past medical,surgical, social and family history. Reviewed medications and allergies.     Objective:   Physical Exam BP 121/80 (BP Location: Left Arm, Patient Position: Sitting, Cuff Size: Normal)   Pulse 94   Ht 5\' 6"  (1.676 m)   Wt (!) 321 lb 12.8 oz (146 kg)   LMP 09/22/2020   BMI 51.94 kg/m  UPT is negative. Skin warm and dry.  Lungs: clear to ausculation bilaterally. Cardiovascular: regular rate and rhythm.    Pelvic: external genitalia is normal in appearance no lesions, vagina:+dark blood,urethra has no lesions or masses noted, cervix:smooth, uterus: normal size, shape and contour, non tender, no masses felt, adnexa: no masses or tenderness noted. Bladder is non tender and no masses felt.  Fall risk is low  Upstream - 10/28/20 0918       Pregnancy Intention Screening   Does the patient want to become pregnant in the next year? Yes    Does the patient's partner want to become pregnant in the next year? Yes    Would the patient like to discuss contraceptive options today? No      Contraception Wrap Up   Current Method Pregnant/Seeking Pregnancy    End Method Pregnant/Seeking Pregnancy    Contraception Counseling Provided No            Pt gave verbal permission to have exam with husband as chaperone.  Assessment:     1. Irregular menstrual bleeding   2. Patient desires pregnancy Take PNV, will refill Will try  femara after bleeding stops  3. History of PCOS   4. Abnormal uterine bleeding (AUB) Will rx megace to stop bleeding Meds ordered this encounter  Medications   megestrol (MEGACE) 40 MG tablet    Sig: Take 3 x 5 days then 2 x 5 days then 1 daily til bleeding stops    Dispense:  45 tablet    Refill:  0    Order Specific Question:   Supervising Provider    Answer:   10/30/20 H [[id number]]   Prenatal Vit-Fe Fumarate-FA (PRENATAL VITAMINS) 28-0.8 MG TABS    Sig: Take 1 tablet by mouth daily.    Dispense:  30 tablet    Refill:  11    Order Specific Question:   Supervising Provider    Answer:   Duane Lope [[id number]]        Plan:     Call me when bleeding stops at least 3-4 days Will rx femara 2.5 mg 1 days 3-5 of cycle Will check progesterone day 21, and TSH

## 2020-11-03 ENCOUNTER — Telehealth: Payer: Self-pay | Admitting: Adult Health

## 2020-11-03 NOTE — Telephone Encounter (Signed)
Pt was told to call Victorino Dike when bleeding stops. Wanted to see what next step is.

## 2020-11-03 NOTE — Telephone Encounter (Signed)
Patient stated she took the 3x and it stopped after 3 days. She continued taking them. She wants to know if you want her to continue the 2x a day or if you want to go ahead and put her on the fertility medicine. She is also w/o insurance at the present moment til she gets put on her husband's. She uses [id number] - [place], [place] - [address].

## 2020-11-04 ENCOUNTER — Telehealth: Payer: Self-pay | Admitting: Adult Health

## 2020-11-04 MED ORDER — LETROZOLE 2.5 MG PO TABS: ORAL_TABLET | ORAL | 2 refills | Status: AC

## 2020-11-04 NOTE — Telephone Encounter (Signed)
Bleeding stopped after taking megace 1 day, non since, she wants to try femara, she will check on price, has no insurance at present but getting on husbands. Can start today if meds not too expensive, or wait for insurance

## 2020-11-04 NOTE — Telephone Encounter (Signed)
Patient called stating that she just wanted to let you know that she Is going to wait until she gets on her husbands insurance before she states the rx

## 2020-11-04 NOTE — Telephone Encounter (Signed)
Ok waiting is fine, on getting insurance

## 2020-11-10 ENCOUNTER — Telehealth: Payer: Self-pay | Admitting: Adult Health

## 2020-11-10 NOTE — Telephone Encounter (Signed)
No answer,

## 2020-11-10 NOTE — Telephone Encounter (Signed)
Patient calling stating that she has came about 4 wks and was giving something to stop her periods but now she is having vaginal cramping and wants to know if she should be worried about it

## 2020-11-10 NOTE — Telephone Encounter (Signed)
Returned pt's call, no answer, left vm 

## 2020-11-10 NOTE — Telephone Encounter (Signed)
Pt returned my call. Two identifiers used. Pt stated that she had bled from 8/20-10/3, stopped bleeding after Megace. She never began the Femara because she wants to wait on getting pregnant. She is having intermittent cramping and isn't sure if her body is trying to start, but wanted to know if she should be concerned or what steps she may need to take next. Pt told her concerns would be sent to [facility] for advisement. Pt confirmed understanding.
